# Patient Record
Sex: Female | Born: 1965 | Race: White | Hispanic: No | Marital: Married | Smoking: Never smoker
Health system: Southern US, Community
[De-identification: ages and names within clinical notes are randomized; demographics above are authoritative.]

## PROBLEM LIST (undated history)

## (undated) DIAGNOSIS — T7840XA Allergy, unspecified, initial encounter: Secondary | ICD-10-CM

## (undated) DIAGNOSIS — G43909 Migraine, unspecified, not intractable, without status migrainosus: Secondary | ICD-10-CM

## (undated) DIAGNOSIS — K219 Gastro-esophageal reflux disease without esophagitis: Secondary | ICD-10-CM

## (undated) HISTORY — DX: Migraine, unspecified, not intractable, without status migrainosus: G43.909

## (undated) HISTORY — DX: Allergy, unspecified, initial encounter: T78.40XA

## (undated) HISTORY — DX: Gastro-esophageal reflux disease without esophagitis: K21.9

## (undated) HISTORY — PX: ABDOMINAL HYSTERECTOMY: SHX81

## (undated) HISTORY — PX: TONSILLECTOMY: SUR1361

---

## 2020-01-18 ENCOUNTER — Other Ambulatory Visit: Payer: Self-pay

## 2020-01-18 ENCOUNTER — Emergency Department (HOSPITAL_BASED_OUTPATIENT_CLINIC_OR_DEPARTMENT_OTHER)
Admission: EM | Admit: 2020-01-18 | Discharge: 2020-01-18 | Disposition: A | Discharge status: Home / Self Care | Payer: BC Managed Care – PPO | Attending: Emergency Medicine | Admitting: Emergency Medicine

## 2020-01-18 ENCOUNTER — Emergency Department (HOSPITAL_BASED_OUTPATIENT_CLINIC_OR_DEPARTMENT_OTHER): Payer: BC Managed Care – PPO

## 2020-01-18 ENCOUNTER — Encounter (HOSPITAL_BASED_OUTPATIENT_CLINIC_OR_DEPARTMENT_OTHER): Payer: Self-pay | Admitting: *Deleted

## 2020-01-18 DIAGNOSIS — R519 Headache, unspecified: Secondary | ICD-10-CM | POA: Diagnosis not present

## 2020-01-18 DIAGNOSIS — R030 Elevated blood-pressure reading, without diagnosis of hypertension: Secondary | ICD-10-CM

## 2020-01-18 DIAGNOSIS — Z79899 Other long term (current) drug therapy: Secondary | ICD-10-CM | POA: Insufficient documentation

## 2020-01-18 DIAGNOSIS — R002 Palpitations: Secondary | ICD-10-CM | POA: Insufficient documentation

## 2020-01-18 LAB — BASIC METABOLIC PANEL
Anion gap: 10 (ref 5–15)
BUN: 16 mg/dL (ref 6–20)
CO2: 26 mmol/L (ref 22–32)
Calcium: 9 mg/dL (ref 8.9–10.3)
Chloride: 101 mmol/L (ref 98–111)
Creatinine, Ser: 1.11 mg/dL — ABNORMAL HIGH (ref 0.44–1.00)
GFR calc Af Amer: >60 mL/min (ref >60)
GFR calc non Af Amer: 57 mL/min — ABNORMAL LOW (ref >60)
Glucose, Bld: 89 mg/dL (ref 70–99)
Potassium: 4.1 mmol/L (ref 3.5–5.1)
Sodium: 137 mmol/L (ref 135–145)

## 2020-01-18 LAB — CBC WITH DIFFERENTIAL/PLATELET
Abs Immature Granulocytes: 0.24 10*3/uL — ABNORMAL HIGH (ref 0.00–0.07)
Basophils Absolute: 0.1 10*3/uL (ref 0.0–0.1)
Basophils Relative: 1 %
Eosinophils Absolute: 0.1 10*3/uL (ref 0.0–0.5)
Eosinophils Relative: 1 %
HCT: 42.2 % (ref 36.0–46.0)
Hemoglobin: 13.3 g/dL (ref 12.0–15.0)
Immature Granulocytes: 2 %
Lymphocytes Relative: 24 %
Lymphs Abs: 2.8 10*3/uL (ref 0.7–4.0)
MCH: 26.9 pg (ref 26.0–34.0)
MCHC: 31.5 g/dL (ref 30.0–36.0)
MCV: 85.3 fL (ref 80.0–100.0)
Monocytes Absolute: 0.8 10*3/uL (ref 0.1–1.0)
Monocytes Relative: 6 %
Neutro Abs: 8 10*3/uL — ABNORMAL HIGH (ref 1.7–7.7)
Neutrophils Relative %: 66 %
Platelets: 364 10*3/uL (ref 150–400)
RBC: 4.95 MIL/uL (ref 3.87–5.11)
RDW: 13.5 % (ref 11.5–15.5)
WBC: 12 10*3/uL — ABNORMAL HIGH (ref 4.0–10.5)
nRBC: 0 % (ref 0.0–0.2)

## 2020-01-18 LAB — TSH: TSH: 0.363 u[IU]/mL (ref 0.350–4.500)

## 2020-01-18 LAB — TROPONIN I (HIGH SENSITIVITY): Troponin I (High Sensitivity): 2 ng/L (ref <18)

## 2020-01-18 LAB — MAGNESIUM: Magnesium: 2.3 mg/dL (ref 1.7–2.4)

## 2020-01-18 MED ORDER — SODIUM CHLORIDE 0.9 % IV BOLUS
500.0000 mL | Freq: Once | INTRAVENOUS | Status: AC
  Administered 2020-01-18: 500 mL via INTRAVENOUS

## 2020-01-18 MED ORDER — DEXAMETHASONE SODIUM PHOSPHATE 10 MG/ML IJ SOLN
10.0000 mg | Freq: Once | INTRAMUSCULAR | Status: AC
  Administered 2020-01-18: 10 mg via INTRAVENOUS
  Filled 2020-01-18: qty 1

## 2020-01-18 MED ORDER — DIPHENHYDRAMINE HCL 50 MG/ML IJ SOLN
12.5000 mg | Freq: Once | INTRAMUSCULAR | Status: AC
  Administered 2020-01-18: 12.5 mg via INTRAVENOUS
  Filled 2020-01-18: qty 1

## 2020-01-18 MED ORDER — METOCLOPRAMIDE HCL 5 MG/ML IJ SOLN
5.0000 mg | Freq: Once | INTRAMUSCULAR | Status: AC
  Administered 2020-01-18: 5 mg via INTRAVENOUS
  Filled 2020-01-18: qty 2

## 2020-01-18 NOTE — ED Provider Notes (Signed)
[facility] EMERGENCY DEPARTMENT Provider Note   CSN: [id number] Arrival date & time: 01/18/20  1157     History Chief Complaint  Patient presents with  . Hypertension  . Palpitations    Tina Herrera is a 54 y.o. female with past medical history of migraines presents to the ED from urgent care for evaluation of headache associated with clamminess, nausea, palpitations, elevated blood pressure reading.    She noticed a mild headache around 8 AM this morning.  Initially mild.  States in the course of a few minutes it became severe.  Described as throbbing, on the right temporal scalp and forehead.  Currently moderate to severe.  Her headache is worse if she coughs or if she moves her head too fast.  She took her rescue migraine medicine but it did not help.  She developed nausea, lightheadedness, clamminess and palpitations in her chest, light sensitivity.  States she was so clammy that her clothes were sticking to her skin.  Typically gets nausea, blurred vision, photosensitivity with her usual migraines.  States she has had flutters in her chest for the last 6 months, has not seen her doctor for it.  No associated chest pain or syncope.  States she has not sought medical care for palpitations because they are very "mild".  She arrived to her job and she checked her blood pressure and it was as high as 170s over 100-120s.  She went to urgent care and they told her they were concerned about her heart and told her to come to the ED.  She never had chest pain.  States she has never had issues with blood pressure in the past.  Yesterday she checked her blood pressure and is 124/76.  She is concerned because all of her family has history of hypertension.  Her father had history of strokes and atrial fibrillation.  Reports long history of migraines, managed by neurology.  Today's headache seems different.  Recently switched her maintenance medicines to an injection.  Since changing her  medicine she rarely has headaches, maybe 1 migraine a month.  She has not had a headache this month.  Also states that her PCP recently diagnosed with for sinusitis, she has finished prednisone for this and just started Bactrim.    No associated vision disturbances, difficulty with speech, difficulty with balance, one-sided weakness or drooping or decrease sensation.  No recent head injury. No anticoagulants. Has noticed some pressure in her sinuses from sinusitis the last few days.  No associated chest pain, shortness of breath or syncope.  No vomiting.  No personal history of CAD, hypertension, diabetes, tobacco use.  History of hyperlipidemia but not on any medicines, diet controlled.  No family history of early onset CAD.  HPI     Past Medical History:  Diagnosis Date  . Allergies   . GERD (gastroesophageal reflux disease)   . Migraines     There are no problems to display for this patient.   Past Surgical History:  Procedure Laterality Date  . ABDOMINAL HYSTERECTOMY    . TONSILLECTOMY       OB History   No obstetric history on file.     No family history on file.  Social History   Tobacco Use  . Smoking status: Never Smoker  . Smokeless tobacco: Never Used  Substance Use Topics  . Alcohol use: Not Currently  . Drug use: Never    Home Medications Prior to Admission medications   Medication Sig  Start Date End Date Taking? Authorizing Provider  albuterol (ACCUNEB) 0.63 MG/3ML nebulizer solution 0.63 mg. 10/04/19  Yes [provider]  cyclobenzaprine (FLEXERIL) 10 MG tablet Take by mouth. 08/19/19 08/19/29 Yes [provider]  eletriptan (RELPAX) 40 MG tablet Take by mouth. 08/09/19  Yes [provider]  esomeprazole (NEXIUM) 20 MG capsule Take 1 tablet by mouth daily. 09/06/19  Yes [provider]  estradiol (VIVELLE-DOT) 0.05 MG/24HR patch Place onto the skin. 07/01/19  Yes [provider]  Galcanezumab-gnlm 120 MG/ML SOAJ  Inject into the skin. 08/09/19 08/09/20 Yes [provider]  linaclotide Karlene Einstein) 145 MCG CAPS capsule Take by mouth. 09/30/19  Yes [provider]  magnesium gluconate (MAGONATE) 500 MG tablet Take 500 mg by mouth 2 (two) times daily.   Yes [provider]  montelukast (SINGULAIR) 10 MG tablet TAKE 1 TABLET BY MOUTH EVERYDAY AT BEDTIME 09/06/19  Yes [provider]  naproxen (NAPROSYN) 500 MG tablet Take by mouth. 10/06/19  Yes [provider]  promethazine (PHENERGAN) 25 MG tablet Take by mouth. 06/11/19  Yes [provider]  sulfamethoxazole-trimethoprim (BACTRIM DS) 800-160 MG tablet Take by mouth. 01/17/20 01/27/20 Yes [provider]  valACYclovir (VALTREX) 1000 MG tablet Take by mouth. 04/29/18  Yes [provider]    Allergies    Amoxicillin  Review of Systems   Review of Systems  Constitutional: Positive for diaphoresis.  HENT: Positive for sinus pressure and sinus pain.   Gastrointestinal: Positive for nausea.  Neurological: Positive for light-headedness and headaches.  All other systems reviewed and are negative.   Physical Exam Updated Vital Signs BP 117/86 (BP Location: Left Arm)   Pulse 86   Temp 98 F (36.7 C) (Oral)   Resp 18   Ht 5\' 5"  (1.651 m)   Wt 88.5 kg   SpO2 100%   BMI 32.45 kg/m   Physical Exam Vitals and nursing note reviewed.  Constitutional:      General: She is not in acute distress.    Appearance: She is well-developed.     Comments: NAD.  HENT:     Head: Normocephalic and atraumatic.     Right Ear: External ear normal.     Left Ear: External ear normal.     Nose: Nose normal.  Eyes:     General: No scleral icterus.    Conjunctiva/sclera: Conjunctivae normal.  Cardiovascular:     Rate and Rhythm: Normal rate and regular rhythm.     Heart sounds: Normal heart sounds.  Pulmonary:     Effort: Pulmonary effort is normal.     Breath sounds: Normal breath sounds.    Musculoskeletal:        General: No deformity. Normal range of motion.     Cervical back: Normal range of motion and neck supple.  Skin:    General: Skin is warm and dry.     Capillary Refill: Capillary refill takes less than 2 seconds.  Neurological:     Mental Status: She is alert and oriented to person, place, and time.     Comments:  Mental Status: Patient is awake, alert, oriented to person, place, year, and situation.  Patient is able to give a clear and coherent history. Speech is fluent and clear without dysarthria or aphasia. No signs of neglect.  Cranial Nerves: I not tested II visual fields full bilaterally. PERRL.  Unable to visualize posterior eye. III, IV, VI EOMs intact without ptosis or diplopia  V sensation to  light touch intact in all 3 divisions of trigeminal nerve bilaterally  VII facial movements symmetric bilaterally VIII hearing intact to voice/conversation  IX, X no uvula deviation, symmetric rise of soft palate/uvula XI 5/5 SCM and trapezius strength bilaterally  XII tongue protrusion midline, symmetric L/R movements  Motor: Strength 5/5 in upper/lower extremities .   Sensation to light touch intact in face, upper/lower extremities. No pronator drift. No leg drop.  Cerebellar: No ataxia with finger to nose.  Steady gait. No truncal sway. Normal Romberg.   Psychiatric:        Behavior: Behavior normal.        Thought Content: Thought content normal.        Judgment: Judgment normal.     ED Results / Procedures / Treatments   Labs (all labs ordered are listed, but only abnormal results are displayed) Labs Reviewed  CBC WITH DIFFERENTIAL/PLATELET - Abnormal; Notable for the following components:      Result Value   WBC 12.0 (*)    Neutro Abs 8.0 (*)    Abs Immature Granulocytes 0.24 (*)    All other components within normal limits  BASIC METABOLIC PANEL - Abnormal; Notable for the following components:   Creatinine, Ser 1.11 (*)    GFR calc  non Af Amer 57 (*)    All other components within normal limits  MAGNESIUM  TSH  TROPONIN I (HIGH SENSITIVITY)    EKG EKG Interpretation  Date/Time:  Tuesday January 18 2020 12:09:42 EDT Ventricular Rate:  98 PR Interval:  156 QRS Duration: 70 QT Interval:  332 QTC Calculation: 423 R Axis:   50 Text Interpretation: Normal sinus rhythm Nonspecific T wave abnormality Abnormal ECG No old tracing to compare Confirmed by Melene Plan [id number]) on 01/18/2020 4:15:30 PM   Radiology CT Head Wo Contrast  Result Date: 01/18/2020 CLINICAL DATA:  Headaches EXAM: CT HEAD WITHOUT CONTRAST TECHNIQUE: Contiguous axial images were obtained from the base of the skull through the vertex without intravenous contrast. COMPARISON:  None. FINDINGS: Brain: No evidence of acute infarction, hemorrhage, hydrocephalus, extra-axial collection or mass lesion/mass effect. Vascular: No hyperdense vessel or unexpected calcification. Skull: Normal. Negative for fracture or focal lesion. Sinuses/Orbits: Diffuse mucosal thickening is noted throughout the paranasal sinuses. Air-fluid levels are noted in the sphenoid sinus bilaterally. Other: None IMPRESSION: Changes of sinusitis likely acute on chronic. No acute intracranial abnormality is noted. Electronically Signed   By: Alcide Clever M.D.   On: 01/18/2020 15:29    Procedures Procedures (including critical care time)  Medications Ordered in ED Medications  metoCLOPramide (REGLAN) injection 5 mg (5 mg Intravenous Given 01/18/20 1523)  diphenhydrAMINE (BENADRYL) injection 12.5 mg (12.5 mg Intravenous Given 01/18/20 1522)  dexamethasone (DECADRON) injection 10 mg (10 mg Intravenous Given 01/18/20 1525)  sodium chloride 0.9 % bolus 500 mL (0 mLs Intravenous Stopped 01/18/20 1600)    ED Course  I have reviewed the triage vital signs and the nursing notes.  Pertinent labs & imaging results that were available during my care of the patient were reviewed by me and considered in my  medical decision making (see chart for details).  Clinical Course as of Jan 17 1745  Tue Jan 18, 2020  1526 WBC(!): 12.0 [CG]  1645 Creatinine(!): 1.11 [CG]  1645 Normal sinus rhythm Nonspecific T wave abnormality Abnormal ECG No old tracing to compare Confirmed by Melene Plan [id number]) on 01/18/2020 4:15:30 PM  ED EKG [CG]  1645 IMPRESSION: Changes of sinusitis likely acute on  chronic.  No acute intracranial abnormality is noted.  CT Head Wo Contrast [CG]  1650 Re-evaluated patient. Reports significant improvement in headache. SBP 128.    [CG]    Clinical Course User Index [CG] Jerrell Mylar   MDM Rules/Calculators/A&P                          54 yo F with history of migraines, currently being treated for sinusitis with bactrim presents from [facility] for evaluation of headaches. Associated with diaphoresis, nausea, palpitations.  Noted elevated BP reading during this.  No history of HTN.   I have reviewed patient's available medical record, UC visit.  They did an EKG which was normal.   Highest on differential diagnosis is sinus headache, break through migraine, complicated migraine.    She has no red flag or risks for ICH like OAC, trauma, stroke symptoms or neurological deficits on exam.  Doubt ischemic CVA, TIA. Doubt meningitis given her history and exam.  Elevated blood pressure is likely secondary to stress and pain response. However, given patient's concern and UC referral will obtain head CT.  Headache onset < 6 hours PTA.  Will order migraine medicine.  Labs, EKG, trop to evaluate for hypertensive emergency/troponin leak.  TSH given palpitations for 6 months. I doubt ACS.   1745: ER work up personally reviewed.  CT shows sinusitis, otherwise benign. Headache onset < 6 hours PTA and I don't think emergent LP necessary today. Labs essentially unremarkable. Undetectable troponin.  TSH pending but no history of thyroid issues  Patient re-evaluated after migraine medicines  and feelt better. Less pain. BP now normal.   Discussed plan to discharge with pcp follow up. She is agreeable. Return precautions discussed.  Final Clinical Impression(s) / ED Diagnoses Final diagnoses:  Headache disorder  Elevated blood pressure reading without diagnosis of hypertension    Rx / DC Orders ED Discharge Orders    None       Liberty Handy, PA-C 01/18/20 1746    Melene Plan, DO 01/18/20 1823

## 2020-01-18 NOTE — ED Triage Notes (Signed)
Headache this am, diaphoretic, nauseated with elevated BP. Palpitations on and off for a few months. No palpitations at triage.

## 2020-01-18 NOTE — Discharge Instructions (Signed)
You were seen in the ED for headache, nausea, sweats, palpitations, elevated blood pressure  CT, EKG and labs were normal  CT noted some sinusitis.  Continue your headache medicines at home.  Continue medicines for sinusitis prescribed by her primary care doctor.  I suspect your headache today may have been related to sinusitis or new or changed migraines.  I think this pain and stress response may have falsely elevated blood pressure.  By the time you are discharged from the ED your migraine/headache improved and your blood pressure was normal  I recommend Afrin nasal spray once a day for the next 3 days to help with sinus congestion and pressure.  After 3 days of Afrin start using Flonase twice a day for ongoing nasal congestion and pressure

## 2020-01-18 NOTE — ED Notes (Signed)
Pt. Reports she was at work today and at approx. 8:15 she felt flutters in her chest and her headache was getting bad so her bp was also 173/115 so as it continued to increase she went to UC and then was sent here although the EKG was WNL.

## 2021-10-19 IMAGING — CT CT HEAD W/O CM
3 series · 15 of 45 positions shown, 18 images · non-contrast
Comparison: None.

CLINICAL DATA: Headaches

EXAM:
CT HEAD WITHOUT CONTRAST
TECHNIQUE: Contiguous axial images were obtained from the base of the skull
through the vertex without intravenous contrast.

[Series 2: head wo · axial · 0.39mm/px · z∈[-123,-8]mm · 9 of 28 slices shown, 12 images]
[im 3/28  brain]
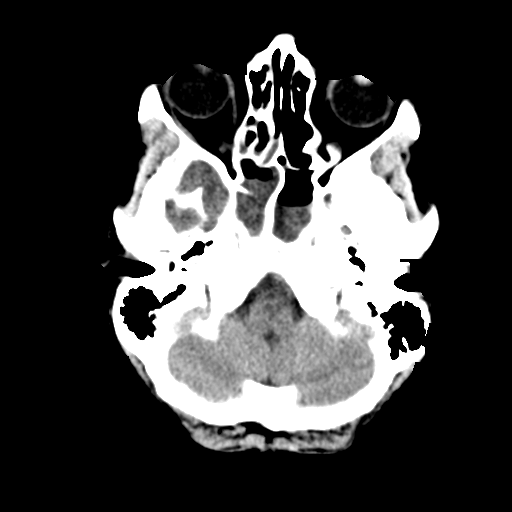
[im 3/28  bone]
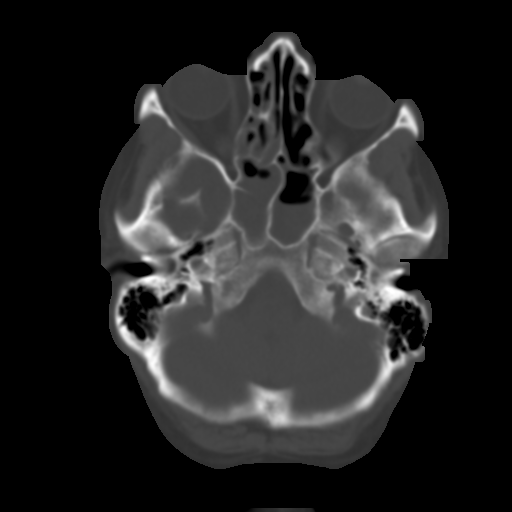
[im 6/28  brain]
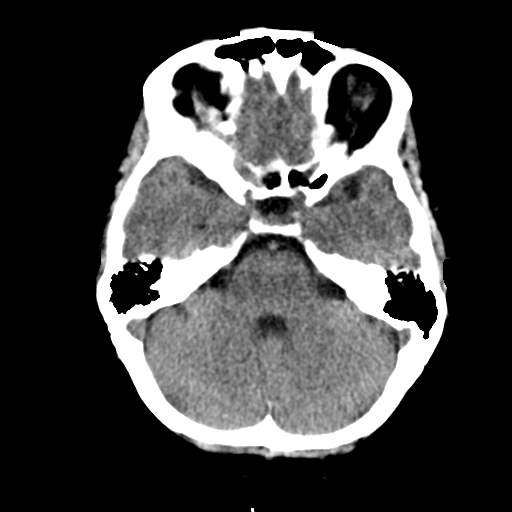
[im 9/28  brain]
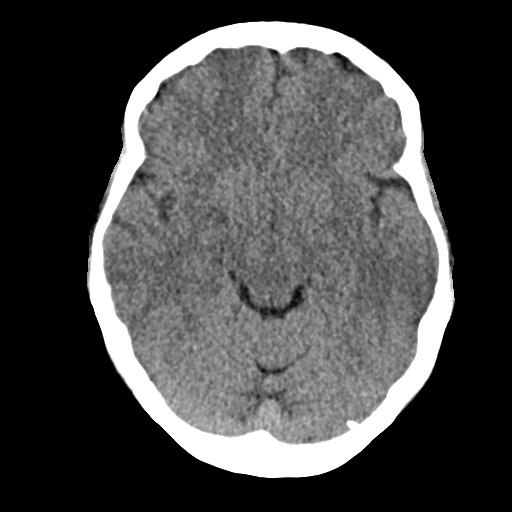
[im 12/28  brain]
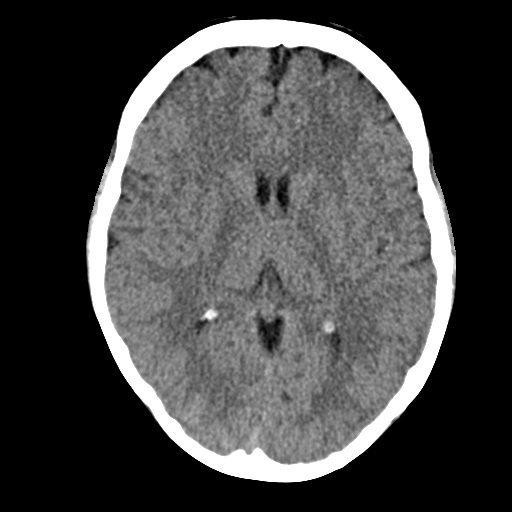
[im 15/28  brain]
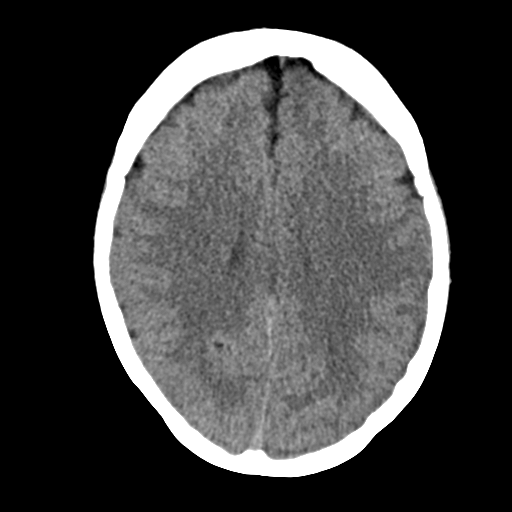
[im 15/28  bone]
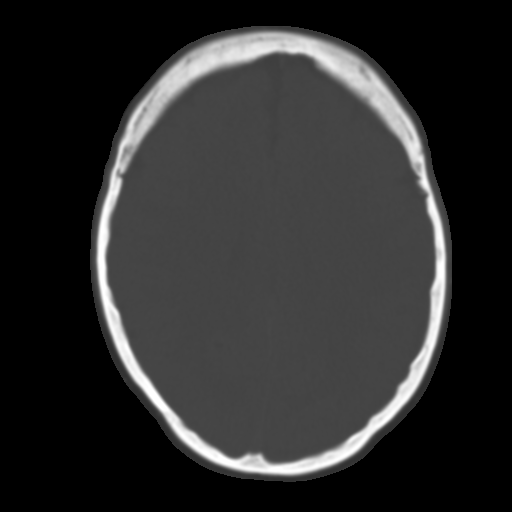
[im 17/28  brain]
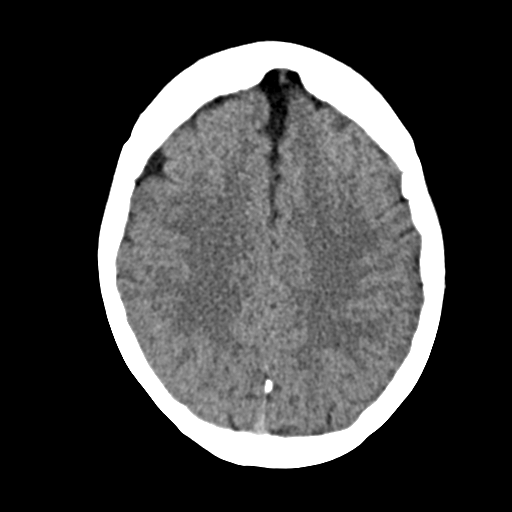
[im 20/28  brain]
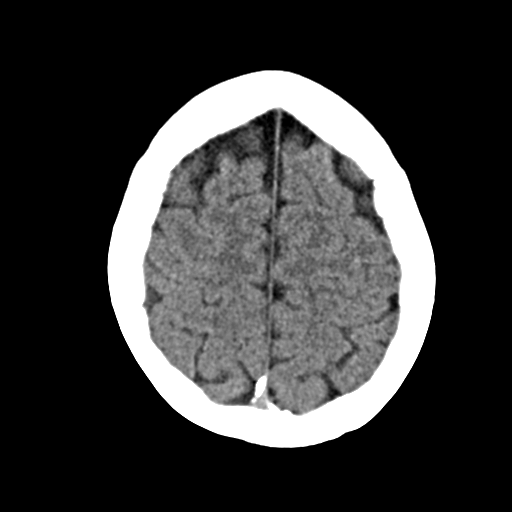
[im 23/28  brain]
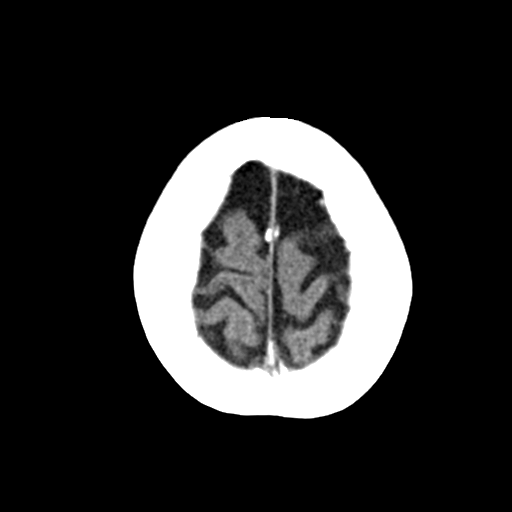
[im 26/28  brain]
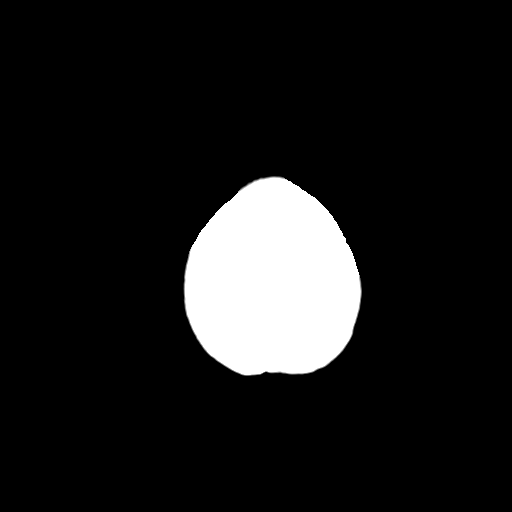
[im 26/28  bone]
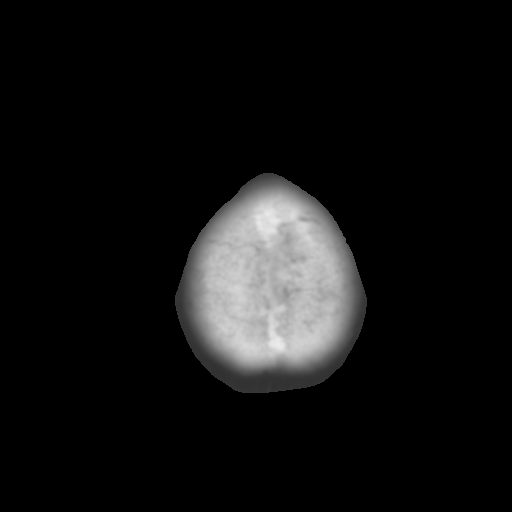

[Series 4: coronal soft · coronal · 0.26mm/px · 3 of 67 slices shown]
[im 23/67  brain]
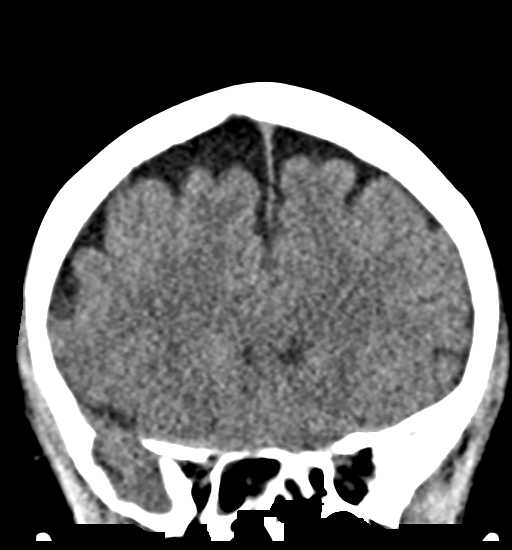
[im 30/67  brain]
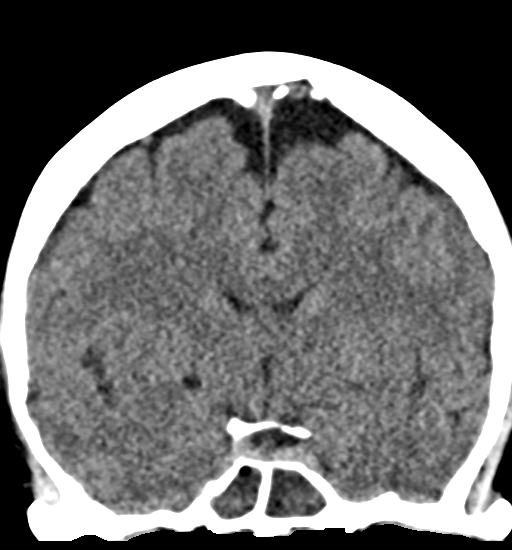
[im 37/67  brain]
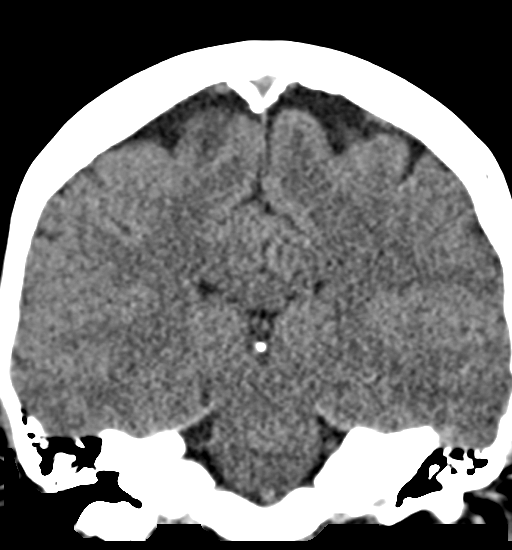

[Series 5: sag soft · sagittal · 0.28mm/px · 3 of 52 slices shown]
[im 18/52  brain]
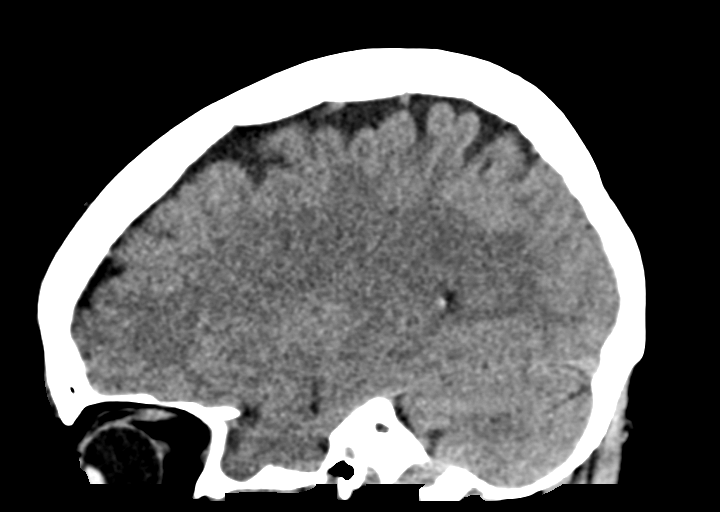
[im 26/52  brain]
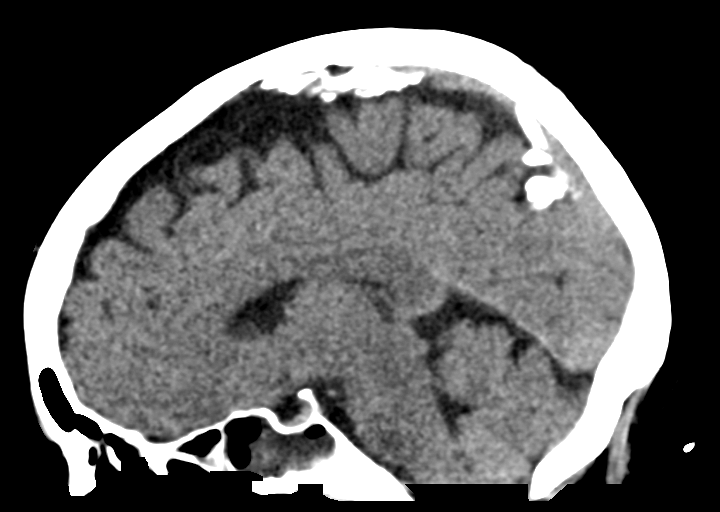
[im 35/52  brain]
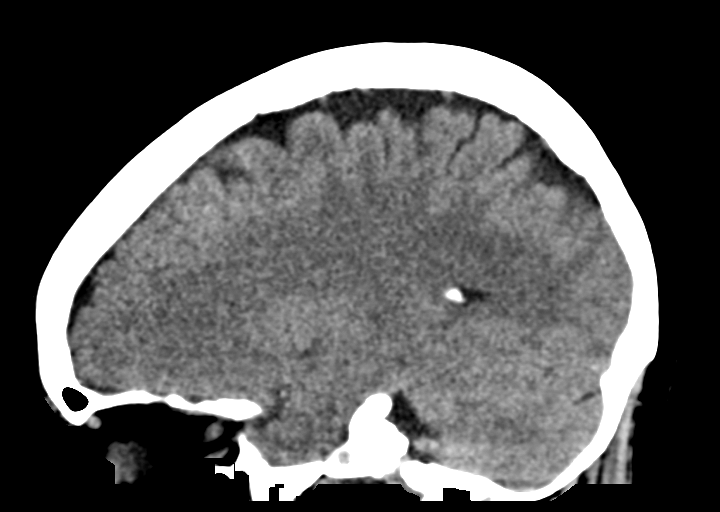

[15 of 45 positions shown; findings below may reference images not displayed]

FINDINGS: Brain: No evidence of acute infarction, hemorrhage, hydrocephalus,
extra-axial collection or mass lesion/mass effect.

Vascular: No hyperdense vessel or unexpected calcification.

Skull: Normal. Negative for fracture or focal lesion.

Sinuses/Orbits: Diffuse mucosal thickening is noted throughout the
paranasal sinuses. Air-fluid levels are noted in the sphenoid sinus
bilaterally.

Other: None
IMPRESSION: Changes of sinusitis likely acute on chronic.

No acute intracranial abnormality is noted.
